# Patient Record
Sex: Male | Born: 1935 | Marital: Married
Health system: Southern US, Community
[De-identification: ages and names within clinical notes are randomized; demographics above are authoritative.]

---

## 2004-06-17 ENCOUNTER — Encounter: Payer: Self-pay | Admitting: Orthopaedic Surgery

## 2004-07-18 ENCOUNTER — Encounter: Payer: Self-pay | Admitting: Orthopaedic Surgery

## 2010-05-01 ENCOUNTER — Encounter: Payer: Self-pay | Admitting: Orthopedic Surgery

## 2010-05-18 ENCOUNTER — Encounter: Payer: Self-pay | Admitting: Orthopedic Surgery

## 2012-07-21 ENCOUNTER — Inpatient Hospital Stay: Payer: Self-pay | Admitting: Internal Medicine

## 2012-07-21 LAB — CK TOTAL AND CKMB (NOT AT ARMC): CK-MB: 0.9 ng/mL (ref 0.5–3.6)

## 2012-07-21 LAB — COMPREHENSIVE METABOLIC PANEL
Albumin: 3.1 g/dL — ABNORMAL LOW (ref 3.4–5.0)
Alkaline Phosphatase: 92 U/L (ref 50–136)
Calcium, Total: 9.1 mg/dL (ref 8.5–10.1)
Chloride: 103 mmol/L (ref 98–107)
Co2: 26 mmol/L (ref 21–32)
EGFR (Non-African Amer.): >60
Osmolality: 278 (ref 275–301)
Potassium: 4.3 mmol/L (ref 3.5–5.1)
SGOT(AST): 29 U/L (ref 15–37)
SGPT (ALT): 19 U/L (ref 12–78)
Sodium: 137 mmol/L (ref 136–145)

## 2012-07-21 LAB — CBC
MCH: 33.2 pg (ref 26.0–34.0)
MCHC: 33.7 g/dL (ref 32.0–36.0)
RDW: 16 % — ABNORMAL HIGH (ref 11.5–14.5)

## 2012-07-21 LAB — TROPONIN I: Troponin-I: <0.02 ng/mL

## 2012-07-21 LAB — PROTIME-INR: INR: 1.1

## 2012-07-22 LAB — URINALYSIS, COMPLETE
Bilirubin,UR: NEGATIVE
Glucose,UR: NEGATIVE mg/dL (ref 0–75)
Hyaline Cast: 43
Ketone: NEGATIVE
Nitrite: NEGATIVE
RBC,UR: 15 /HPF (ref 0–5)
Specific Gravity: 1.023 (ref 1.003–1.030)
Squamous Epithelial: 6
WBC UR: 169 /HPF (ref 0–5)

## 2012-07-24 DIAGNOSIS — I369 Nonrheumatic tricuspid valve disorder, unspecified: Secondary | ICD-10-CM

## 2012-07-24 LAB — BASIC METABOLIC PANEL
Anion Gap: 10 (ref 7–16)
EGFR (African American): >60
EGFR (Non-African Amer.): >60
Glucose: 136 mg/dL — ABNORMAL HIGH (ref 65–99)
Osmolality: 283 (ref 275–301)
Potassium: 4.2 mmol/L (ref 3.5–5.1)
Sodium: 138 mmol/L (ref 136–145)

## 2012-07-24 LAB — CBC WITH DIFFERENTIAL/PLATELET
Basophil #: 0 10*3/uL (ref 0.0–0.1)
Eosinophil #: 0.4 10*3/uL (ref 0.0–0.7)
HCT: 35 % — ABNORMAL LOW (ref 40.0–52.0)
Lymphocyte %: 7.5 %
MCV: 99 fL (ref 80–100)
Monocyte %: 8.3 %
Neutrophil #: 10 10*3/uL — ABNORMAL HIGH (ref 1.4–6.5)
Neutrophil %: 80.8 %
RBC: 3.55 10*6/uL — ABNORMAL LOW (ref 4.40–5.90)
RDW: 15.7 % — ABNORMAL HIGH (ref 11.5–14.5)
WBC: 12.4 10*3/uL — ABNORMAL HIGH (ref 3.8–10.6)

## 2012-07-24 LAB — VANCOMYCIN, TROUGH: Vancomycin, Trough: 13 ug/mL (ref 10–20)

## 2012-07-24 LAB — APTT: Activated PTT: 33 secs (ref 23.6–35.9)

## 2012-07-25 LAB — URINALYSIS, COMPLETE
Glucose,UR: NEGATIVE mg/dL (ref 0–75)
Nitrite: NEGATIVE
Ph: 6 (ref 4.5–8.0)
Protein: NEGATIVE
RBC,UR: 2 /HPF (ref 0–5)
WBC UR: 9 /HPF (ref 0–5)

## 2012-07-25 LAB — PLATELET COUNT: Platelet: 141 10*3/uL — ABNORMAL LOW (ref 150–440)

## 2012-07-25 LAB — HEMOGLOBIN: HGB: 11.6 g/dL — ABNORMAL LOW (ref 13.0–18.0)

## 2012-07-27 LAB — CULTURE, BLOOD (SINGLE)

## 2012-07-30 LAB — URINE CULTURE

## 2013-05-10 IMAGING — NM NM LUNG SCAN
2 series · 16 of 16 positions shown · non-contrast
Comparison: none

REASON FOR EXAM: extenisve DVT, now has hypotension
COMMENTS:

[Series 1000: lung perfusion · 1.95mm/px · 4 acquisitions, 8 frames shown]
[im 1/4]
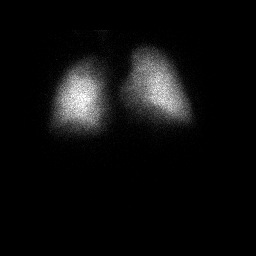
[im 1/4]
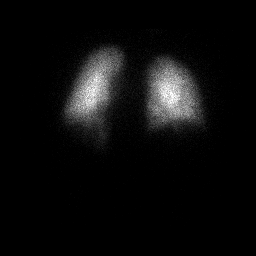
[im 2/4]
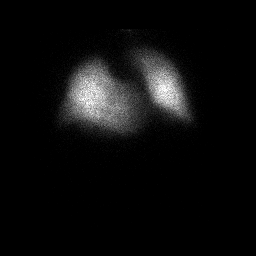
[im 2/4]
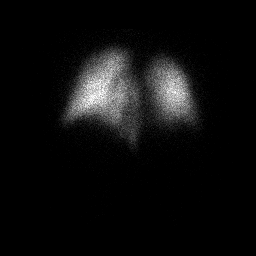
[im 3/4]
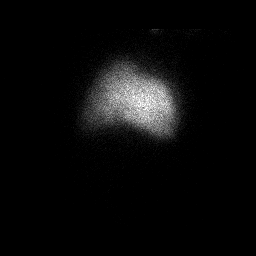
[im 3/4]
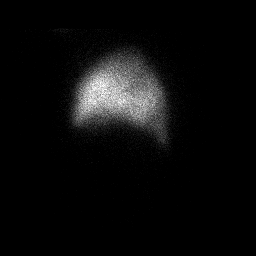
[im 4/4]
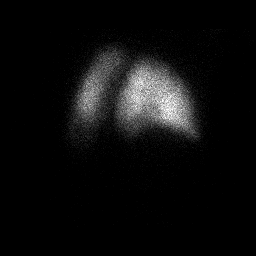
[im 4/4]
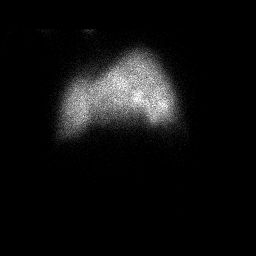

[Series 1000: lung ventilation · 3.90mm/px · 4 acquisitions, 8 frames shown]
[im 1/4]
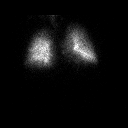
[im 1/4]
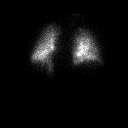
[im 2/4]
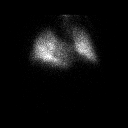
[im 2/4]
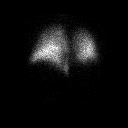
[im 3/4]
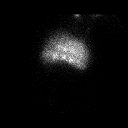
[im 3/4]
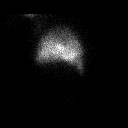
[im 4/4]
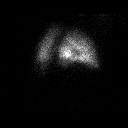
[im 4/4]
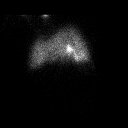

[16 of 16 positions shown; findings below may reference images not displayed]

PROCEDURE:     NM  - NM VQ LUNG SCAN  - [DATE]  [DATE] [DATE] [DATE]

RESULT:     The patient received 23.308 mCi of technetium 99m labeled DTPA
via aerosol for the ventilation portion of the study. The patient received
4.29 mCi of technetium 99m labeled MAA intravenously for the perfusion
study. Comparison is made to today's chest x-ray.

The ventilation images reveal somewhat patchy deposition of the
radiopharmaceutical in the central airways. The perfusion studies reveal no
areas of photopenia. No mismatched defects are demonstrated.
IMPRESSION: The findings are consistent with a low probability for
acute pulmonary embolism.

[REDACTED]

## 2015-01-04 NOTE — Consult Note (Signed)
PATIENT NAME:  Ruben Baker, Ruben Baker MR#:  [id number] DATE OF BIRTH:  April 11, 1933  DATE OF CONSULTATION:  07/25/2012  REFERRING PHYSICIAN:  Katharina Caper, MD CONSULTING PHYSICIAN:  Rosalyn Gess. Roniqua Kintz, MD  REASON FOR CONSULTATION: Bacteremia and Staphylococcus aureus in the urine.   HISTORY OF PRESENT ILLNESS: The patient is a 81 year old man with a past medical history significant for peripheral vascular disease and deep vein thrombosis complicated by recent hospitalization for a contusion while on Coumadin who was admitted from rehab on 03/20/2012 with dizziness, diaphoresis, and hypotension while exercising in physical therapy. The patient had apparently been in the hospital, at the [place], and his Coumadin had been stopped due to severe contusion in his right lower leg. He was sent to physical therapy and while working on hand exercises he began having the above symptoms. His exercises were stopped and he was sent to the Emergency Room. He was given IV fluids and blood cultures and urine cultures were obtained. The blood cultures are growing coagulase-negative staph in one set of two and his urinalysis had some evidence for pyuria and the cultures are growing greater than 100,000 CFUs/mL of staph aureus, as well as other organisms. The patient did not have a Foley catheter in place in rehab but did have one when he was hospitalized before. He denies any fevers, chills, or sweats. He denies any dysuria or increased frequency or any hematuria. He has been on vancomycin since his blood cultures became positive. All of his presenting symptoms have improved.   ALLERGIES: Shellfish.   PAST MEDICAL HISTORY:  1. Right lower extremity deep vein thrombosis. His course was complicated by development of a contusion and deep tissue bleeding. He is no longer on Coumadin and IVC filter has been placed.  2. Coronary artery disease.  3. Peripheral vascular disease.  4. Degenerative hip disease.  5. Hypertension.   6. Gastroesophageal reflux.   FAMILY HISTORY: Positive for hypertension.   SOCIAL HISTORY: The patient was recently at [facility] rehab. He does not smoke. He does not drink.  REVIEW OF SYSTEMS: GENERAL: No current fevers, chills, sweats, malaise, or fatigue. He was having some fatigue and dizziness prior to admission, however. HEENT: No headaches. No sinus congestion. No sore throat. NECK: No stiffness. No swollen glands. RESPIRATORY: No cough or shortness of breath. No sputum production. CARDIAC: No chest pains or palpitations. No peripheral edema. GI: No nausea, no vomiting, no abdominal pain, and no change in his bowels. GENITOURINARY: He denies any dysuria. No hematuria. No increased frequency. No feeling of urgency. SKIN: No rashes other than a hematoma on the left leg. MUSCULOSKELETAL: He has pain in his right leg related to his prior bleeding in his right hip. NEUROLOGIC: He had dizziness and malaise on admission. These have resolved. PSYCHIATRIC: No complaints.   PHYSICAL EXAMINATION:   VITAL SIGNS: T-max 98.4, T-current 97.2, pulse 104, blood pressure 117/73, and 97% on room air.   GENERAL: A 81 year old white man in no acute distress.   HEENT: Normocephalic, atraumatic. Pupils equal and reactive to light. Extraocular motion intact. Sclerae, conjunctivae, and lids are without evidence for emboli or petechiae. Oropharynx shows no erythema or exudate. Teeth and gums are in fair condition.   NECK: Supple. Full range of motion. Midline trachea. No lymphadenopathy. No thyromegaly.   LUNGS: Clear to auscultation bilaterally. Good air movement. No focal consolidation.   HEART: Regular rate and rhythm without murmur, rub, or gallop.   ABDOMEN: Soft, nontender, and nondistended. No hepatosplenomegaly. No hernia  is noted.   EXTREMITIES: He had some discomfort in both lower extremities, in the thigh area. He had contusions that were obvious posteriorly on the left. There was no evidence for  tenosynovitis.   SKIN: No rashes other than the contusions. There was no stigmata of endocarditis, specifically no Janeway lesions or Osler nodes.   NEUROLOGIC: The patient is awake and interactive. He was a somewhat poor historian and would kind of go off on tangents about things that happened significantly long ago, but was able to answer questions and provide a fairly decent history with prompting. He was moving all four extremities.   PSYCHIATRIC: Mood and affect appeared normal.   LABS/RADIOLOGIC STUDIES: BUN 28, creatinine 0.89, bicarbonate 25, and anion gap 10.   LFTs were unremarkable, except for a total bilirubin of 1.2 on admission.   White count is 10.2 today with no differential. Yesterday's white count was 12.4 with a hemoglobin of 12.2, platelet count of 134, and ANC of 10.0. White count on admission was 12.4.   Blood cultures from admission are growing staph hominis in two of four bottles, both from one set.   A urinalysis from admission had 2+ blood, 3+ leukocyte esterase, 15 red cells, 169 white cells, and there were 6 epithelial cells present.   Urine culture is growing 70,000 CFU/mL of gram-positive cocci that is yet to be identified and greater than 100,000 CFU/mL of Staphylococcus aureus. There was noted to be mixed bacterial organisms. The staph aureus sensitivities are not back yet.   A chest x-ray from admission shows no infiltrates.   Venous Dopplers of the lower extremities showed bilateral DVTs.  V/Q scan showed low probability for pulmonary embolism.   IMPRESSION: A 81 year old man with a history of peripheral vascular disease and deep vein thrombosis complicated by confusion on Coumadin who is admitted with a staph aureus urinary tract infection.   RECOMMENDATIONS:  1. He was admitted with diaphoresis and hypotension. His urine culture is growing staph aureus. His blood cultures grew coagulase-negative staph in one set. He did not have a Foley catheter in  place but did have one recently at the [facility]. I suspect that he has an ascending UTI related to the Foley.  2. The sensitivities are not back yet for staph aureus. We will change him to trimethoprim sulfamethoxazole. 3. Would treat with trimethoprim sulfamethoxazole for 10 days. We will discontinue the vancomycin.  4. The coagulase-negative staph is likely a contaminant and does not require treatment.  5. As he is not being treated with Coumadin, there is no need to worry about the effects of trimethoprim sulfamethoxazole on the INR.   This is a moderately complex infectious disease case. Thank you very much for involving me in Mr. Jasinski's care. ____________________________ Rosalyn GessMichael E. Elisabel Hanover, MD meb:slb D: 07/25/2012 14:47:53 ET T: 07/25/2012 15:18:30 ET JOB#: [id number]  cc: Rosalyn GessMichael E. Ilani Otterson, MD, <Dictator> Arisbel Maione E Hamzah Savoca MD ELECTRONICALLY SIGNED 07/29/2012 11:52

## 2015-01-04 NOTE — H&P (Signed)
PATIENT NAME:  Ruben Baker, [name] MR#:  [id number] DATE OF BIRTH:  Jan 06, 1933  DATE OF ADMISSION:  07/21/2012  PRIMARY CARE PHYSICIAN: [facility]   CHIEF COMPLAINT: Low blood pressure. Patient presented with diaphoresis, nausea and blood pressure of 72/51.   History is obtained from patient's son and ER physician along with patient.    HISTORY OF PRESENT ILLNESS: Ruben Baker is a 81 year old Caucasian gentleman with multiple medical problems was [facility] about 10 to 12 days ago where he was admitted with right lower extremity swelling, pain and difficulty walking. Prior to that he was diagnosed with right lower extremity deep vein thrombosis, was started on anticoagulation with Coumadin and patient got admitted about 10 days ago and they final he had contusion of his right lower extremity with possible hematoma and internal bleeding in his right lower extremity. He required some blood transfusion and his anticoagulants were discontinued. Patient also has peripheral vascular disease and needs to get a stent placed in the right lower extremity, however, was not able to do so because of his new onset deep venous thrombosis. He was then discharged to [facility] for [facility]. Comes in today from [facility] after he felt sweaty/diaphoretic, nausea and not feeling well overall while he was doing physical therapy. His blood pressure was 72/51, came to the Emergency Room.   In the Emergency Room patient received IV fluids. Currently his blood pressure is 99/62. He is feeling much better and back to his baseline. His EKG shows normal sinus rhythm. Patient denies any chest pain, shortness of breath or any focal weakness. He is being admitted for hypotension of unclear etiology. Denies any fever, cough, or any dysuria or any abdominal pain either.   PAST MEDICAL HISTORY:  1. Right lower extremity deep vein thrombosis, not on anticoagulation due to internal bleed/contusion of the right lower  extremity, taken off the Coumadin, now has IVC filter placed.  2. Coronary artery disease.  3. Peripheral vascular disease mainly on the right lower extremity.  4. Left hip degenerative joint disease.  5. Hypertension.  6. History of constipation.  7. Esophagitis/gastroesophageal reflux disease.   ALLERGIES: Shellfish.   MEDICATIONS:  1. Zetia 10 mg 1/2 tablet p.o. daily.  2. Tylenol 325, 2 tablets 3 times a day.  3. Tramadol 50 mg 1 tablet every six hours.  4. Toprol-XL 50 mg p.o. daily.  5. Cozaar 25 mg 2 tablets daily.  6. Senokot-S 1 tablet b.i.d.  7. Prilosec 40 mg b.i.d.  8. Neurontin 200 mg 3 times a day.  9. Lactulose 30 mL every six hours.  10. Docusate 2 capsules 2 times a day.  11. Cyclobenzaprine 10 mg 1 tablet 3 times a day.   FAMILY HISTORY: Positive for hypertension.   SOCIAL HISTORY: Currently lives at [facility]. No smoking. No alcohol.    REVIEW OF SYSTEMS: CONSTITUTIONAL: Positive for fatigue, weakness. No fever. EYES: No blurred or double vision or glaucoma. ENT: No tinnitus, ear pain, hearing loss or epistaxis. RESPIRATORY: No cough, wheeze, chronic obstructive pulmonary disease. CARDIOVASCULAR: No chest pain, orthopnea, edema. Positive for hypotension. GASTROINTESTINAL: No nausea, vomiting, diarrhea, abdominal pain, gastroesophageal reflux disease or constipation. GENITOURINARY: No dysuria, hematuria, or frequency. ENDOCRINE: No polyuria or nocturia. HEMATOLOGY: No anemia or easy bruising. SKIN: No acne. Positive for contusion and bruises over his right thigh. MUSCULOSKELETAL: Positive for arthritis. NEUROLOGIC: No cerebrovascular accident. PSYCH: No anxiety or depression. All other systems reviewed and negative.   LABORATORY, DIAGNOSTIC AND RADIOLOGICAL DATA:  EKG shows normal sinus rhythm.   Ultrasound of the abdomen shows patency of the visualized portion of inferior vena cava.   Chest x-ray no acute cardiopulmonary abnormality.   White count 12.2,  hemoglobin and hematocrit 13.2 and 39.0, platelet count 269. Glucose 144. Rest of the chemistries within normal limits. Albumin is 3.1. Troponin 0.02. PT-INR 14.2 and 1.1. Magnesium 2.   ASSESSMENT: 81 year old Ruben Baker with history of coronary artery disease, deep venous thrombosis and history of peripheral vascular disease presents from [place] with:  1. Symptomatic hypotension with blood pressure 72/51. Etiology unclear, however, could be because of medications. Patient is on good dose of metoprolol and Cozaar. Patient came in after he felt diaphoretic and nauseated at [facility] while doing physical therapy. Blood pressure after IV fluids is 99/62. Patient feels better. No source of infection identified. Will hold beta blockers, Cozaar. Admit patient overnight for telemonitoring and continue IV fluids for now.  2. Right lower extremity deep vein thrombosis, chronic. Patient was on deep vein thrombosis, taken off since his hemoglobin and hematocrit dropped due to right lower extremity hematoma at [facility]. Received a couple of units of blood transfusion at [facility] about two weeks ago. Patient now has IVC filter.  3. Right lower extremity peripheral vascular disease. Apparently needs stents to be placed by vascular surgery per family. Can follow up with [facility] vascular surgeon at discharge.  4. History of hypertension with relative hypotension. Hold blood pressure medications.  5. Gastroesophageal reflux disease. On Prilosec.   6. History of coronary artery disease.  7. Further work-up according to patient's clinical course. Hospital admission plan was discussed with patient and patient's family members. Due to his low blood pressure is not able to be transferred to [facility] will be admitted at [facility]. Family is agreeable with it.   TIME SPENT: 50 minutes.   ____________________________ Wylie Hail Allena Katz, MD sap:cms D: 07/21/2012 17:29:49 ET T: 07/22/2012 06:00:06 ET JOB#: [id number]  cc: Shalom Mcguiness A.  Allena Katz, MD, <Dictator> [facility] Willow Ora MD ELECTRONICALLY SIGNED 07/22/2012 14:11

## 2015-01-04 NOTE — Consult Note (Signed)
Patient admitted with hypotension, possible PEBLE DVT.  By report had an IVC filter placed at outside institution.  Had GI bleed, and this was reason for placement.floor.  BP betterreally a candidate for thrombolysis with recent GI bleedfilter is in place, likely little benefit of placement of a new filter.  May consider restarting anticoagulation if possible with extensive thrombotic disease.  Don't really know how bad the GI bleed was, and if it was minor benefit of anticoagulation would likely outweigh the risk of bleeding.ordered KUB to see if filter is actually in place  Electronic Signatures: Annice Needyew, Lee Kalt S (MD)  (Signed on 07-Nov-13 16:44)  Authored  Last Updated: 07-Nov-13 16:44 by Annice Needyew, Waunetta Riggle S (MD)

## 2015-01-04 NOTE — Consult Note (Signed)
Impression: 81yo Wmale M w/ h/o PVD, DVT complicated by contusion on coumadin admitted with Staph aureus UTI.  He was admitted with diaphoresis and hypotension.  His urine culture is growing Staph aureus.  His BCx grew CNS in one set.  He does not have a foley catheter in place, but did have one recently at the [facility].  I suspect that he has an ascending UTI related to the foley. The sensitivities are not yet back for the Staph aureus.  Will change him to TMP/SMX. 3)  Would treat with TMP/SMX x 10 days.  d/c vanco. The CNS is likely a contaminant and does not require treatment. As he is not being treated with coumadin, there is no need to worry about affects of TMP/SMX on the INR.   Electronic Signatures: Messina Kosinski, Rosalyn GessMichael E (MD) (Signed on 08-Nov-13 13:22)  Authored   Last Updated: 08-Nov-13 14:37 by Efrain Clauson, Rosalyn GessMichael E (MD)

## 2015-01-04 NOTE — Discharge Summary (Signed)
PATIENT NAME:  Ruben Baker, Ruben Baker MR#:  [id number] DATE OF BIRTH:  [birth year]  DATE OF ADMISSION:  07/21/2012 DATE OF DISCHARGE:  07/25/2012  ADDENDUM: The patient was evaluated by Dr. Leavy CellaBlocker, Infectious Disease specialist, regarding his bacteremia as well as urinary tract infection or Staphylococcus aureus. He felt that urinary tract infection was very likely related to indwelling Foley catheter which was previously placed during his past hospitalization and it needs to be treated as usual urinary tract infections. He recommended to initiate the patient on Septra DS 1 tablet twice a day for 10 days. Dr. Leavy CellaBlocker also did not feel that the patient's bacteremia, which was coagulase negative Staphylococcus, is anything but contamination.  It is recommended to follow the patient's white blood cell count as well as urinalysis periodically to ensure improvement of the patient's pyuria. The patient is being discharged in stable condition with the above-mentioned medications and follow-up. On the day of discharge, temperature is 97.2, pulse 104, respiratory rate 18, blood pressure 117/73, saturation 97% on room air at rest. The patient also was seen by Dr. Festus BarrenJason Dew in regards to peripheral artery disease. The patient is to follow up with Dr. Wyn Quakerew in the next one week after discharge.   ____________________________ Katharina Caperima Romey Mathieson, MD rv:cbb D: 07/25/2012 13:00:25 ET T: 07/25/2012 13:10:01 ET JOB#: [id number]  cc: Katharina Caperima Juergen Hardenbrook, MD, <Dictator> Jamare Vanatta MD ELECTRONICALLY SIGNED 08/01/2012 13:05

## 2015-01-04 NOTE — Discharge Summary (Signed)
PATIENT NAME:  Ruben Baker, Ruben Baker MR#:  [id number] DATE OF BIRTH:  1933-04-13  DATE OF ADMISSION:  07/21/2012 DATE OF DISCHARGE:  07/25/2012  ADMITTING DIAGNOSIS: Hypotension.   DISCHARGE DIAGNOSES:  1. Orthostatic hypotension likely dehydration related. No pulmonary embolus on VQ scanning.  2. History of left lower extremity extensive deep venous thrombosis, status post IVC filter placement in the past.  3. Pyuria with Staphylococcus aureus in urine cultures, suspected contamination. 4. Bacteremia with coagulase-negative Staphylococcus, suspected contamination.  5. Leukocytosis. 6. Generalized weakness. 7. Skin candidiasis.  8. Left anterior thigh ulcer secondary to TEDs.  9. Lower extremity swelling due to deep venous thrombosis.  10. History of peripheral vascular disease, coronary artery disease, degenerative disk disease, hypertension, constipation, and gastroesophageal reflux disease.   DISCHARGE CONDITION: Fair.   DISCHARGE MEDICATIONS: The patient is to resume his outpatient medications which include the following.  1. Cozaar 25 mg p.o. two tablets once daily. 2. Cyclobenzaprine 10 mg three times daily as needed. 3. Docusate sodium 100 mg 2 capsules twice daily.  4. Lactulose 30 mL every six hours as needed.  5. Neurontin 200 mg three times daily. 6. Prilosec 20 mg 2 capsules twice daily. 7. Senokot 8.6 mg 1 tablet twice daily. 8. Toprol-XL 100 mg tablet 1/2 tablet once a day.  9. Tramadol 50 mg every six hours as needed.  10. Tylenol 325 mg 2 tablets every four hours as needed.  11. Zetia 10 mg p.o. half tablet once daily. 12. Nystatin topical powder applied to affected area twice a day.   HOME OXYGEN: None.   DIET: 2 grams salt, low fat, low cholesterol.  Mechanical soft diet was recommended.   ACTIVITY LIMITATIONS: As tolerated.   REFERRALS: Physical therapy 2 to 7 times a week.   DISCHARGE FOLLOWUP: Follow-up appointment with the [facility] in two days  after discharge, Dr. Festus Barren or Dr. Levora Dredge in one week after discharge.   CONSULTANTS: 1. Festus Barren, MD - Vascular Surgery. 2. Care Management.  RADIOLOGIC STUDIES: Chest x-ray, portable single view, 07/21/2012, showed no evidence of acute cardiopulmonary disease.   Ultrasound of abdomen, limited survey, on 07/21/2012, showed patency of visualized portion of inferior vena cava.  Repeated chest x-ray, PA and lateral, on 07/23/2012, showed minimal basilar atelectasis, however infiltrate is not excluded.   KUB on 07/24/2012 showed inferior vena cava filter present.   Ultrasound of carotid arteries, bilateral, on 07/24/2012, showed no evidence of hemodynamically significant carotid stenosis.   Ultrasound of bilateral lower extremities, on 07/24/2012, showed findings consistent with bilateral deep vein thrombosis.  Chest, portable single view, on 07/25/2012, showed no acute cardiopulmonary disease. No definite infiltrate. Slightly improved aeration.  MRI of brain without contrast, 07/24/2012, showed no acute intracranial pathology.   Lung V/Q scan on 07/25/2012 revealed findings consistent with low probability for acute pulmonary embolism.   Echocardiogram on 07/24/2012 showed sinus tachycardia noted, otherwise essentially normal study. Left ventricular systolic function is normal with ejection fraction of more than 55%. Transmitral spectral Doppler flow pattern is suggestive of impaired left ventricular relaxation. Right ventricular systolic function is normal. Right ventricular systolic pressure is elevated at 30 to 40 mmHg.  HOSPITAL COURSE: The patient is a 81 year old Caucasian male with past medical history significant for history of deep vein thrombosis, history of bleeding in his thigh on therapeutic doses, who presented to the hospital with complaints of low blood pressures. Please refer to Dr. Eliane Decree admission on 07/21/2012.  On arrival to the  Emergency Room, the  patient's blood pressure was 99/60 but initially apparently it was 70/50. His lab data on 07/21/2012 showed glucose of 144, otherwise BMP was unremarkable. The patient's magnesium level was normal at 2.0. Liver enzymes revealed albumin level 3.1 and total bilirubin of 1.2. Cardiac enzymes were normal. White blood cell count was slightly elevated to 12.2, hemoglobin 13.2, and platelet count 268. Coagulation panel was unremarkable. The patient's blood cultures x2 were taken and two out of four blood cultures came back positive for Staphylococcus hominis species sensitive to all antibiotics. Urinalysis revealed pyuria with white blood cell count of 169, red blood cells 15, 3+ leukocyte esterase, 2+ blood, 20 mg/dL protein, and negative for nitrites. Mucus was present as well as epithelial cells. The patient's urine cultures came back positive for 70,000 colony-forming units, gram-positive cocci, and more than 100,000 colony forming units of Staphylococcus aureus. The patient's EKG showed normal sinus rhythm with left axis deviation, otherwise, no abnormalities were noted.   The patient was admitted to the hospital for further evaluation. He was rehydrated. With rehydration, his blood pressure improved. After rehydration he was noted to have significant lower extremity swelling. It was unclear if the patient had patent IVC filter. However, numerous studies revealed that it was patent. The patient was noted to have bilateral significant lower extremity deep vein thrombosis with patent IVC. It was felt that the patient's low blood pressure was very likely due to some dehydration of unclear etiology. The patient was initiated on antibiotic therapy because of leukocytosis. His blood cultures were positive for Staphylococcus hominis sensitive to all antibiotics. It was unclear if it was contamination but it was felt that it could be contamination. The patient had an echocardiogram done which was normal. The patient had  also urinalysis taken which showed gram-positive cocci as well as Staphylococcus aureus which was also felt to be contamination, likely. The patient will have urinalysis repeated. If that shows somewhat pyuria, the patient will likely be initiated on antibiotic therapy. With rehydration, however, the patient's condition improved. His vital signs also remained stable during his stay. However, it still showed some tilting on orthostatic vital signs checked. The patient is being discharged to a skilled nursing facility for further recommendations. [facility] In regards to coronary artery disease and degenerative disk disease as well as hypertension, constipation, and gastroesophageal reflux disease he is to continue his outpatient medications. For his history of known peripheral artery disease, consultation with Dr. Wyn Quakerew or Dr. Gilda CreaseSchnier was obtained, however, the patient is to followup with Dr. Wyn Quakerew as an outpatient for peripheral vascular disease reevaluation.  TIME SPENT: 40 minutes.  ____________________________ Katharina Caperima Yuniel Blaney, MD rv:[id number] D: 07/25/2012 11:26:00 ET T: 07/25/2012 11:52:42 ET JOB#: [id number]  cc: Katharina Caperima Cambrea Kirt, MD, <Dictator> [facility] Vernette Moise MD ELECTRONICALLY SIGNED 08/01/2012 13:05

## 2022-10-18 DEATH — deceased
